# Patient Record
Sex: Female | Born: 1965 | Race: Black or African American | Hispanic: No | Marital: Single | Smoking: Current some day smoker
Health system: Southern US, Community
[De-identification: ages and names within clinical notes are randomized; demographics above are authoritative.]

## PROBLEM LIST (undated history)

## (undated) DIAGNOSIS — B20 Human immunodeficiency virus [HIV] disease: Secondary | ICD-10-CM

## (undated) DIAGNOSIS — B1920 Unspecified viral hepatitis C without hepatic coma: Secondary | ICD-10-CM

## (undated) DIAGNOSIS — J449 Chronic obstructive pulmonary disease, unspecified: Secondary | ICD-10-CM

## (undated) HISTORY — PX: ABDOMINAL HYSTERECTOMY: SHX81

## (undated) HISTORY — PX: ABDOMINAL SURGERY: SHX537

---

## 2017-02-23 ENCOUNTER — Encounter: Payer: Self-pay | Admitting: Emergency Medicine

## 2017-02-23 ENCOUNTER — Emergency Department
Admission: EM | Admit: 2017-02-23 | Discharge: 2017-02-23 | Disposition: A | Discharge status: Home / Self Care | Payer: Self-pay | Attending: Student in an Organized Health Care Education/Training Program | Admitting: Student in an Organized Health Care Education/Training Program

## 2017-02-23 ENCOUNTER — Emergency Department: Payer: Self-pay

## 2017-02-23 DIAGNOSIS — Z79899 Other long term (current) drug therapy: Secondary | ICD-10-CM | POA: Insufficient documentation

## 2017-02-23 DIAGNOSIS — F17200 Nicotine dependence, unspecified, uncomplicated: Secondary | ICD-10-CM | POA: Insufficient documentation

## 2017-02-23 DIAGNOSIS — J209 Acute bronchitis, unspecified: Secondary | ICD-10-CM | POA: Insufficient documentation

## 2017-02-23 DIAGNOSIS — R05 Cough: Secondary | ICD-10-CM | POA: Insufficient documentation

## 2017-02-23 DIAGNOSIS — B20 Human immunodeficiency virus [HIV] disease: Secondary | ICD-10-CM | POA: Insufficient documentation

## 2017-02-23 DIAGNOSIS — J449 Chronic obstructive pulmonary disease, unspecified: Secondary | ICD-10-CM | POA: Insufficient documentation

## 2017-02-23 DIAGNOSIS — R0602 Shortness of breath: Secondary | ICD-10-CM | POA: Insufficient documentation

## 2017-02-23 HISTORY — DX: Unspecified viral hepatitis C without hepatic coma: B19.20

## 2017-02-23 HISTORY — DX: Human immunodeficiency virus (HIV) disease: B20

## 2017-02-23 HISTORY — DX: Chronic obstructive pulmonary disease, unspecified: J44.9

## 2017-02-23 MED ORDER — AZITHROMYCIN 250 MG PO TABS: ORAL_TABLET | ORAL | 0 refills | Status: DC

## 2017-02-23 MED ORDER — IPRATROPIUM-ALBUTEROL 0.5-2.5 (3) MG/3ML IN SOLN
3.0000 mL | Freq: Once | RESPIRATORY_TRACT | Status: AC
  Administered 2017-02-23: 3 mL via RESPIRATORY_TRACT
  Filled 2017-02-23: qty 3

## 2017-02-23 MED ORDER — ALBUTEROL SULFATE HFA 108 (90 BASE) MCG/ACT IN AERS: 2.0000 | INHALATION_SPRAY | Freq: Four times a day (QID) | RESPIRATORY_TRACT | 2 refills | Status: AC | PRN

## 2017-02-23 MED ORDER — PREDNISONE 10 MG (21) PO TBPK: ORAL_TABLET | ORAL | 0 refills | Status: DC

## 2017-02-23 NOTE — ED Provider Notes (Signed)
[facility] Emergency Department Provider Note  ____________________________________________   First MD Initiated Contact with Patient 02/23/17 1518     (approximate)  I have reviewed the triage vital signs and the nursing notes.   HISTORY  Chief Complaint Nasal Congestion and Cough    HPI Kenney Housemananya Dobbins is a 52 y.o. female complains of cough and congestion, she states she has been a little short of breath today she is winded easily, states she has not noticed wheezing but she does feel like she cannot move air , she states she has had the symptoms for a week, she had a low-grade fever couple of days ago but none today, she does have nasal congestion and sinus pain, she denies any swelling in the feet or ankles  Past Medical History:  Diagnosis Date  . COPD (chronic obstructive pulmonary disease) (HCC)   . Hepatitis C   . HIV (human immunodeficiency virus infection) (HCC)     There are no active problems to display for this patient.   Past Surgical History:  Procedure Laterality Date  . ABDOMINAL SURGERY      Prior to Admission medications   Medication Sig Start Date End Date Taking? Authorizing Provider  albuterol (PROVENTIL HFA;VENTOLIN HFA) 108 (90 Base) MCG/ACT inhaler Inhale 2 puffs into the lungs every 6 (six) hours as needed for wheezing or shortness of breath. 02/23/17   Sherrie MustacheFisher, Roselyn BeringSusan W, PA-C  azithromycin (ZITHROMAX Z-PAK) 250 MG tablet 2 pills today then 1 pill a day for 4 days 02/23/17   Sherrie MustacheFisher, Roselyn BeringSusan W, PA-C  predniSONE (STERAPRED UNI-PAK 21 TAB) 10 MG (21) TBPK tablet Take 6 pills on day one then decrease by 1 pill each day 02/23/17   Faythe GheeFisher, Rocko Fesperman W, PA-C    Allergies Patient has no known allergies.  No family history on file.  Social History Social History   Tobacco Use  . Smoking status: Current Some Day Smoker  . Smokeless tobacco: Never Used  Substance Use Topics  . Alcohol use: Yes  . Drug use: No    Review of  Systems  Constitutional: No fever/chills Eyes: No visual changes. ENT: No sore throat.  Positive sinus pain and congestion Respiratory: Positive cough ABD: Denies vomiting or diarrhea Genitourinary: Negative for dysuria. Musculoskeletal: Negative for back pain. Skin: Negative for rash.    ____________________________________________   PHYSICAL EXAM:  VITAL SIGNS: ED Triage Vitals [02/23/17 1506]  Enc Vitals Group     BP (!) 137/92     Pulse Rate 89     Resp 20     Temp 98.3 F (36.8 C)     Temp Source Oral     SpO2 99 %     Weight 192 lb (87.1 kg)     Height 5\' 6"  (1.676 m)     Head Circumference      Peak Flow      Pain Score 9     Pain Loc      Pain Edu?      Excl. in GC?     Constitutional: Alert and oriented. Well appearing and in no acute distress. Eyes: Conjunctivae are normal.  Head: Atraumatic. Nose: Positive congestion/rhinnorhea. Mouth/Throat: Mucous membranes are moist.   Cardiovascular: Normal rate, regular rhythm.  Heart sounds are normal Respiratory: Normal respiratory effort.  No retractions, lungs with decreased air movement, patient become short of breath moving from the chair to the stretcher GU: deferred Musculoskeletal: FROM all extremities, warm and well perfused Neurologic:  Normal speech and language.  Skin:  Skin is warm, dry and intact. No rash noted. Psychiatric: Mood and affect are normal. Speech and behavior are normal.  ____________________________________________   LABS (all labs ordered are listed, but only abnormal results are displayed)  Labs Reviewed - No data to display ____________________________________________   ____________________________________________  RADIOLOGY   Chest x-ray is negative for pneumonia ____________________________________________   PROCEDURES  Procedure(s) performed: DuoNeb x2 Procedures    ____________________________________________   INITIAL IMPRESSION / ASSESSMENT AND PLAN /  ED COURSE  Pertinent labs & imaging results that were available during my care of the patient were reviewed by me and considered in my medical decision making (see chart for details).  Patient's 53 year old female presents to the emergency department complaining of cough and some shortness of breath, she states she has been sick for a week, she had a low-grade fever a few days ago but none today, she states she just feels like she is not moving air through her lungs  On physical exam the patient appears nontoxic, she is afebrile, lungs are clear but she has decreased air movement, she gets a little short of breath when she moves from a chair over to the stretcher, the remainder of the exam is benign    ----------------------------------------- 4:45 PM on 02/23/2017 -----------------------------------------  Chest x-ray is negative, patient is now wheezing after the DuoNeb, she has increased air movement, second DuoNeb was ordered  ----------------------------------------- 6:47 PM on 02/23/2017 -----------------------------------------  After the second DuoNeb, the lungs are clear to auscultation and the patient states she feels much better, diagnosis is bronchitis, she is discharged with a prescription for Z-Pak, steroid pack, and albuterol inhaler  As part of my medical decision making, I reviewed the following data within the electronic MEDICAL RECORD NUMBER Old chart reviewed, Radiograph reviewed cxr was normal, Notes from prior ED visits and [place] Controlled Substance Database  ____________________________________________   FINAL CLINICAL IMPRESSION(S) / ED DIAGNOSES  Final diagnoses:  Acute bronchitis, unspecified organism      NEW MEDICATIONS STARTED DURING THIS VISIT:  New Prescriptions   ALBUTEROL (PROVENTIL HFA;VENTOLIN HFA) 108 (90 BASE) MCG/ACT INHALER    Inhale 2 puffs into the lungs every 6 (six) hours as needed for wheezing or shortness of breath.   AZITHROMYCIN (ZITHROMAX  Z-PAK) 250 MG TABLET    2 pills today then 1 pill a day for 4 days   PREDNISONE (STERAPRED UNI-PAK 21 TAB) 10 MG (21) TBPK TABLET    Take 6 pills on day one then decrease by 1 pill each day     Note:  This document was prepared using Dragon voice recognition software and may include unintentional dictation errors.    Faythe Ghee, PA-C 02/23/17 Tito Dine, MD 02/23/17 1900

## 2017-02-23 NOTE — Discharge Instructions (Signed)
Follow-up with your regular doctor if you are not better in 3-5 days, use medication as prescribed, return to the emergency department if you are worsening

## 2017-02-23 NOTE — ED Notes (Signed)
Cough and nasal congestion X 1 week. Reports nonproductive cough. Pt alert and oriented X4, active, cooperative, pt in NAD. RR even and unlabored, color WNL.

## 2017-02-23 NOTE — ED Triage Notes (Signed)
Pt comes into the ED via [facility] c/o cough and congestion.  Denies any fevers at home and patient denies any chest pain or shortness of breath.  Patient ambulatory at this time with even and unlabored respirations.  Patient states he cough is dry with no production.

## 2017-12-05 ENCOUNTER — Ambulatory Visit: Payer: Self-pay

## 2018-01-30 ENCOUNTER — Ambulatory Visit: Payer: Self-pay | Attending: Oncology

## 2018-01-30 ENCOUNTER — Ambulatory Visit
Admission: RE | Admit: 2018-01-30 | Discharge: 2018-01-30 | Disposition: A | Discharge status: Home / Self Care | Payer: Self-pay | Source: Ambulatory Visit | Attending: Oncology | Admitting: Oncology

## 2018-01-30 ENCOUNTER — Encounter (INDEPENDENT_AMBULATORY_CARE_PROVIDER_SITE_OTHER): Payer: Self-pay

## 2018-01-30 VITALS — Ht 66.0 in | Wt 200.0 lb

## 2018-01-30 DIAGNOSIS — Z0000 Encounter for general adult medical examination without abnormal findings: Secondary | ICD-10-CM | POA: Insufficient documentation

## 2018-01-30 NOTE — Progress Notes (Signed)
  Subjective:     Patient ID: Emily Riggs, female   DOB: [birth year] Riggs, 52 y.o.   MRN: [id number]  HPI   Review of Systems     Objective:   Physical Exam Chest:     Breasts:        Right: No swelling, bleeding, inverted nipple, mass, nipple discharge, skin change or tenderness.        Left: No swelling, bleeding, inverted nipple, mass, nipple discharge, skin change (large pendulous breasts) or tenderness.     Comments: Large pendulous breasts       Assessment:     52 year old patient presents for [facility] clinic visit.  Patient screened, and meets [facility] eligibility.  Patient does not have insurance, Medicare or Medicaid.  Handout given on Affordable Care Act.  Instructed patient on breast self awareness using teach back method.  Clinical breast exam unremarkable.  No mass or lump palpated.  Patient reports [place] and [place] was [place].  Explained radiologist would probably wait for outside films to read mammogram.  Risk Assessment    Risk Scores      01/30/2018   Last edited by: Emily Riggs, Emily Gillingham F, RN   5-year risk: 1.3 %   Lifetime risk: 8.2 %             Plan:     Sent for bilateral screening mammogram.

## 2018-02-09 ENCOUNTER — Emergency Department: Payer: Self-pay

## 2018-02-09 ENCOUNTER — Emergency Department
Admission: EM | Admit: 2018-02-09 | Discharge: 2018-02-09 | Disposition: A | Discharge status: Home / Self Care | Payer: Self-pay | Attending: Student in an Organized Health Care Education/Training Program | Admitting: Student in an Organized Health Care Education/Training Program

## 2018-02-09 ENCOUNTER — Other Ambulatory Visit: Payer: Self-pay

## 2018-02-09 DIAGNOSIS — S42254A Nondisplaced fracture of greater tuberosity of right humerus, initial encounter for closed fracture: Secondary | ICD-10-CM | POA: Insufficient documentation

## 2018-02-09 DIAGNOSIS — Y929 Unspecified place or not applicable: Secondary | ICD-10-CM | POA: Insufficient documentation

## 2018-02-09 DIAGNOSIS — Y92009 Unspecified place in unspecified non-institutional (private) residence as the place of occurrence of the external cause: Secondary | ICD-10-CM

## 2018-02-09 DIAGNOSIS — Y939 Activity, unspecified: Secondary | ICD-10-CM | POA: Insufficient documentation

## 2018-02-09 DIAGNOSIS — F17200 Nicotine dependence, unspecified, uncomplicated: Secondary | ICD-10-CM | POA: Insufficient documentation

## 2018-02-09 DIAGNOSIS — W19XXXA Unspecified fall, initial encounter: Secondary | ICD-10-CM

## 2018-02-09 DIAGNOSIS — Y999 Unspecified external cause status: Secondary | ICD-10-CM | POA: Insufficient documentation

## 2018-02-09 DIAGNOSIS — W010XXA Fall on same level from slipping, tripping and stumbling without subsequent striking against object, initial encounter: Secondary | ICD-10-CM | POA: Insufficient documentation

## 2018-02-09 MED ORDER — NAPROXEN 500 MG PO TABS: 500.0000 mg | ORAL_TABLET | Freq: Two times a day (BID) | ORAL | 0 refills | Status: AC

## 2018-02-09 MED ORDER — HYDROCODONE-ACETAMINOPHEN 5-325 MG PO TABS: 1.0000 | ORAL_TABLET | Freq: Every evening | ORAL | 0 refills | Status: AC | PRN

## 2018-02-09 NOTE — ED Notes (Signed)
See triage note  Presents s/p fall   States she fell about 1 month ago  States she had her arm out in front of her  Has been having pain to right shoulder but states pain is getting worse and moving into neck

## 2018-02-09 NOTE — Discharge Instructions (Signed)
Follow-up with your primary care provider if any continued pain medication as needed.  Also Dr. Odis Luster is on call for orthopedics.  You may call make an appointment with him if any continued problems with your shoulder.  X-rays show that it is healing.  Begin taking naproxen 500 mg twice daily for inflammation and pain.  Norco 1 tablet at bedtime if needed for pain to allow you to sleep.

## 2018-02-09 NOTE — ED Provider Notes (Signed)
[facility] Emergency Department Provider Note   ____________________________________________   None    (approximate)  I have reviewed the triage vital signs and the nursing notes.   HISTORY  Chief Complaint Shoulder Pain   HPI Emily Riggs is a 53 y.o. female presents to the ED with complaint of right shoulder pain for 1 month.  Patient states that she slipped at home and hit her right shoulder.  Patient denies any head injury or loss of consciousness during this event.  Patient has been taking Tylenol for shoulder pain without any relief.  Patient states that pain was worse last evening while trying to sleep.  She rates her pain as 6 out of 10.   Past Medical History:  Diagnosis Date  . COPD (chronic obstructive pulmonary disease) (HCC)   . Hepatitis C   . HIV (human immunodeficiency virus infection) (HCC)     There are no active problems to display for this patient.   Past Surgical History:  Procedure Laterality Date  . ABDOMINAL HYSTERECTOMY    . ABDOMINAL SURGERY      Prior to Admission medications   Medication Sig Start Date End Date Taking? Authorizing Provider  albuterol (PROVENTIL HFA;VENTOLIN HFA) 108 (90 Base) MCG/ACT inhaler Inhale 2 puffs into the lungs every 6 (six) hours as needed for wheezing or shortness of breath. 02/23/17   Fisher, Roselyn BeringSusan W, PA-C  HYDROcodone-acetaminophen (NORCO/VICODIN) 5-325 MG tablet Take 1 tablet by mouth at bedtime as needed for moderate pain. 02/09/18   Tommi RumpsSummers, Raysha Tilmon L, PA-C  naproxen (NAPROSYN) 500 MG tablet Take 1 tablet (500 mg total) by mouth 2 (two) times daily with a meal. 02/09/18   Tommi RumpsSummers, Hersel Mcmeen L, PA-C    Allergies Patient has no known allergies.  No family history on file.  Social History Social History   Tobacco Use  . Smoking status: Current Some Day Smoker  . Smokeless tobacco: Never Used  Substance Use Topics  . Alcohol use: Yes  . Drug use: No    Review of  Systems Constitutional: No fever/chills Eyes: No visual changes. ENT: No trauma. Cardiovascular: Denies chest pain. Respiratory: Denies shortness of breath. Gastrointestinal: No abdominal pain.  No nausea, no vomiting. Musculoskeletal: Positive right shoulder pain. Skin: Negative for rash. Neurological: Negative for headaches, focal weakness or numbness. Allergic/Immunilogical: Positive for HIV.  Positive for hepatitis C.  ____________________________________________   PHYSICAL EXAM:  VITAL SIGNS: ED Triage Vitals  Enc Vitals Group     BP 02/09/18 0644 128/83     Pulse Rate 02/09/18 0644 78     Resp 02/09/18 0644 18     Temp 02/09/18 0644 98 F (36.7 C)     Temp Source 02/09/18 0644 Oral     SpO2 02/09/18 0644 98 %     Weight 02/09/18 0642 200 lb (90.7 kg)     Height 02/09/18 0642 5\' 6"  (1.676 m)     Head Circumference --      Peak Flow --      Pain Score 02/09/18 0642 6     Pain Loc --      Pain Edu? --      Excl. in GC? --    Constitutional: Alert and oriented. Well appearing and in no acute distress. Eyes: Conjunctivae are normal.  Head: Atraumatic. Nose: No congestion/rhinnorhea. Mouth/Throat: Mucous membranes are moist.  Oropharynx non-erythematous. Neck: No stridor.   Cardiovascular: Normal rate, regular rhythm. Grossly normal heart sounds.  Good peripheral circulation. Respiratory: Normal  respiratory effort.  No retractions. Lungs CTAB. Gastrointestinal: Soft and nontender. No distention.  Musculoskeletal: Examination of the right shoulder there is no gross deformity and no ecchymosis or abrasions were seen.  Range of motion is without crepitus but is limited in abduction secondary to discomfort.  Patient is able to abduct to shoulder level without difficulty but above the shoulder is difficult and painful.  Skin is intact.  Pulse is present.  Good muscle strength bilaterally. Neurologic:  Normal speech and language. No gross focal neurologic deficits are  appreciated. No gait instability. Skin:  Skin is warm, dry and intact.  No ecchymosis or abrasions seen. Psychiatric: Mood and affect are normal. Speech and behavior are normal.  ____________________________________________   LABS (all labs ordered are listed, but only abnormal results are displayed)  Labs Reviewed - No data to display   RADIOLOGY  ED MD interpretation:  Right shoulder x-ray shows nondisplaced fracture greater tuberosity of the humerus.  Official radiology report(s): Dg Shoulder Right  Result Date: 02/09/2018 CLINICAL DATA:  53 year old female with right-sided shoulder pain for the past month since slipping and falling while at home EXAM: RIGHT SHOULDER - 2+ VIEW COMPARISON:  None. FINDINGS: Irregularity in the region of the greater tuberosity. Suspect an underlying nondisplaced healing fracture. The humeral head remains located. Minimal degenerative change at the glenohumeral joint. The visualized thorax is within normal limits. No lytic or blastic osseous lesion. IMPRESSION: Suspect a healing, nondisplaced fracture of the greater tuberosity of the humerus. Electronically Signed   By: Malachy MoanHeath  McCullough M.D.   On: 02/09/2018 07:38   ____________________________________________   PROCEDURES  Procedure(s) performed: None  Procedures  Critical Care performed: No  ____________________________________________   INITIAL IMPRESSION / ASSESSMENT AND PLAN / ED COURSE  As part of my medical decision making, I reviewed the following data within the electronic MEDICAL RECORD NUMBER Notes from prior ED visits and [place] Controlled Substance Database  Patient presents to the ED with complaint of right shoulder pain.  Patient states that she fell 1 month ago at home while celebrating.  Patient denies any head injury or loss of consciousness during this event.  She has been taking over-the-counter Tylenol without relief.  X-ray is suggestive of a healing nondisplaced fracture of the  greater tuberosity right humerus.  Patient was made aware.  Patient was given a prescription for naproxen 500 mg twice daily for the next 7 days and Norco 1 at bedtime as needed for pain.  ____________________________________________   FINAL CLINICAL IMPRESSION(S) / ED DIAGNOSES  Final diagnoses:  Closed nondisplaced fracture of greater tuberosity of right humerus, initial encounter  Fall at home, initial encounter     ED Discharge Orders         Ordered    naproxen (NAPROSYN) 500 MG tablet  2 times daily with meals     02/09/18 0821    HYDROcodone-acetaminophen (NORCO/VICODIN) 5-325 MG tablet  At bedtime PRN     02/09/18 [id number]           Note:  This document was prepared using Dragon voice recognition software and may include unintentional dictation errors.    Tommi RumpsSummers, Slevin Gunby L, PA-C 02/09/18 1141    Willy Eddyobinson, Patrick, MD 02/09/18 1227

## 2018-02-09 NOTE — ED Triage Notes (Signed)
Pt arrives to ED via POV from home with c/o right-sided shoulder pain x1 month. Pt reports having a slip and fall at home, no head injury or LOC. Pt reports consistent right shoulder pain since the fall, has taken OTC medications without relief. Pt has strong and equal hand grips, CMS intact.

## 2018-02-13 NOTE — Progress Notes (Unsigned)
Letter mailed from [facility] to notify of normal mammogram results.  Patient to return in one year for annual screening.  Copy to [facility]. 

## 2019-01-21 IMAGING — MG DIGITAL SCREENING BILATERAL MAMMOGRAM WITH TOMO AND CAD
8 series · 8 of 24 positions shown · non-contrast
Comparison: Previous exam(s).

CLINICAL DATA: Screening.

EXAM:
DIGITAL SCREENING BILATERAL MAMMOGRAM WITH TOMO AND CAD

[R CC synth-2D]
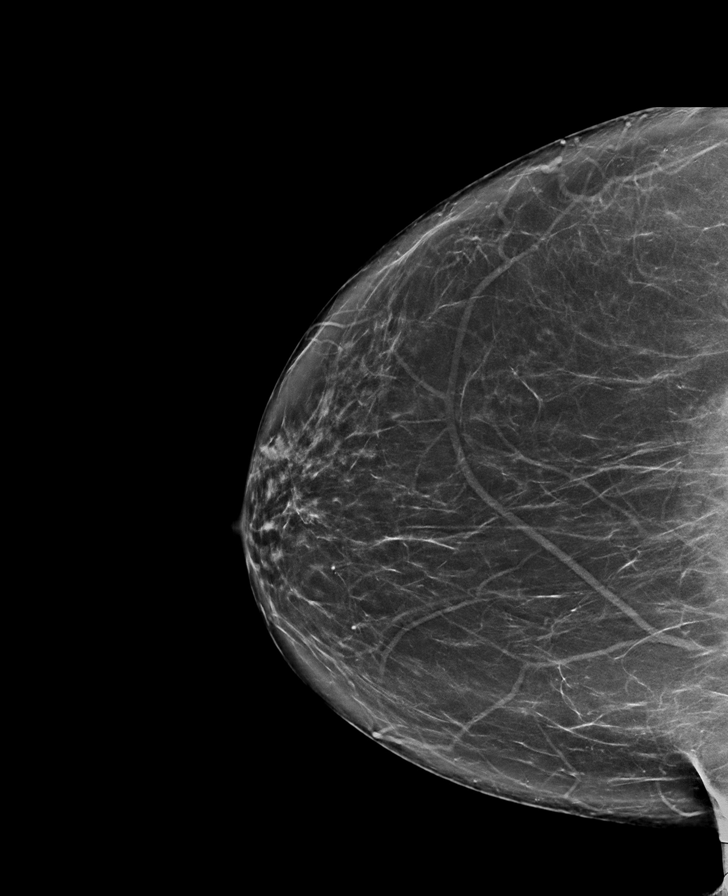

[L MLO synth-2D]
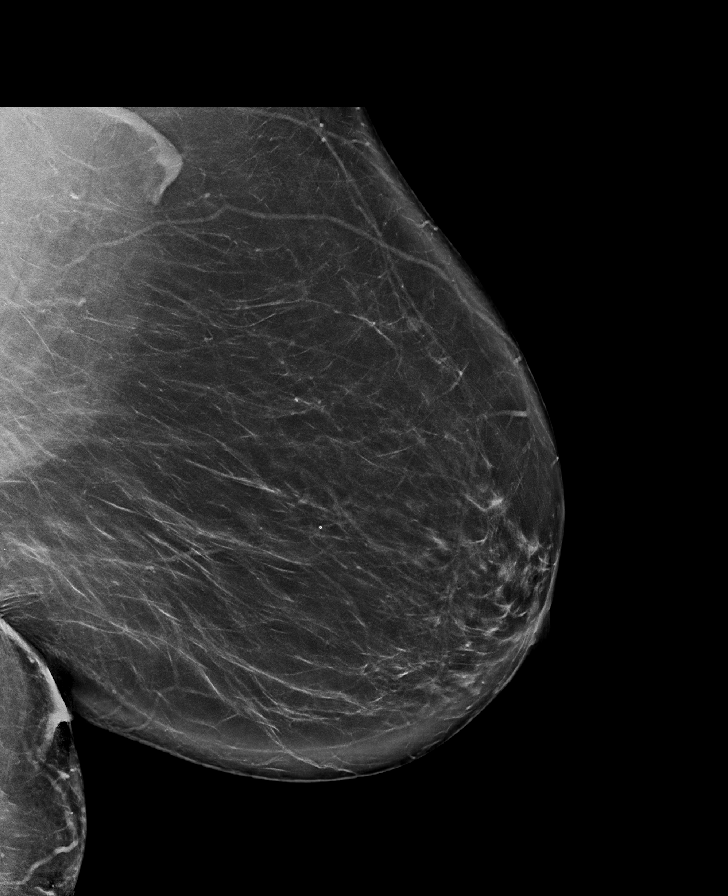

[L CC synth-2D]
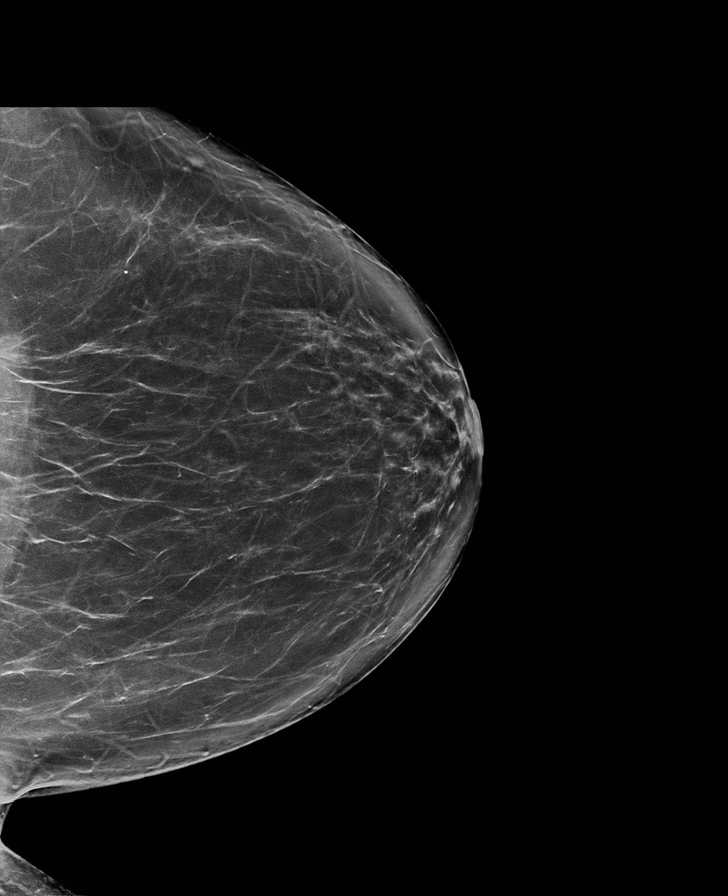

[R MLO synth-2D]
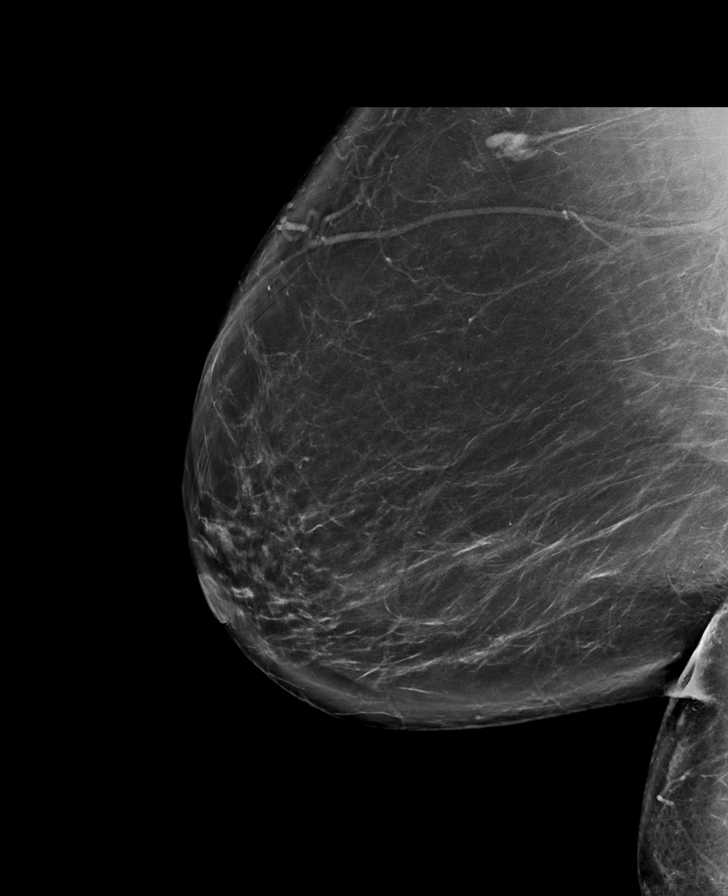

[R MLO tomo · tomo slice 49/97.0]
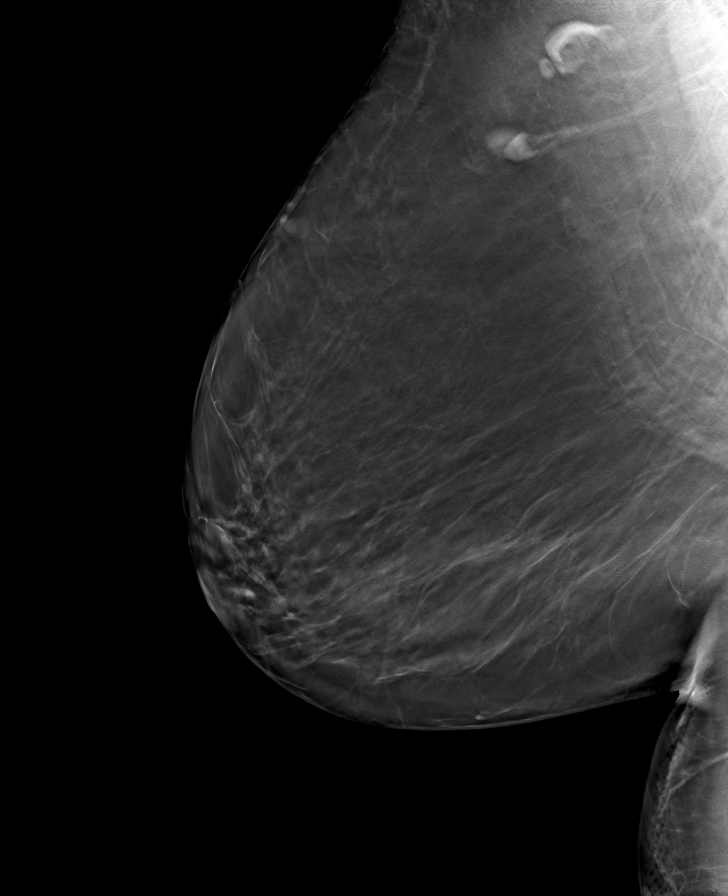

[L MLO tomo · tomo slice 47/94.0]
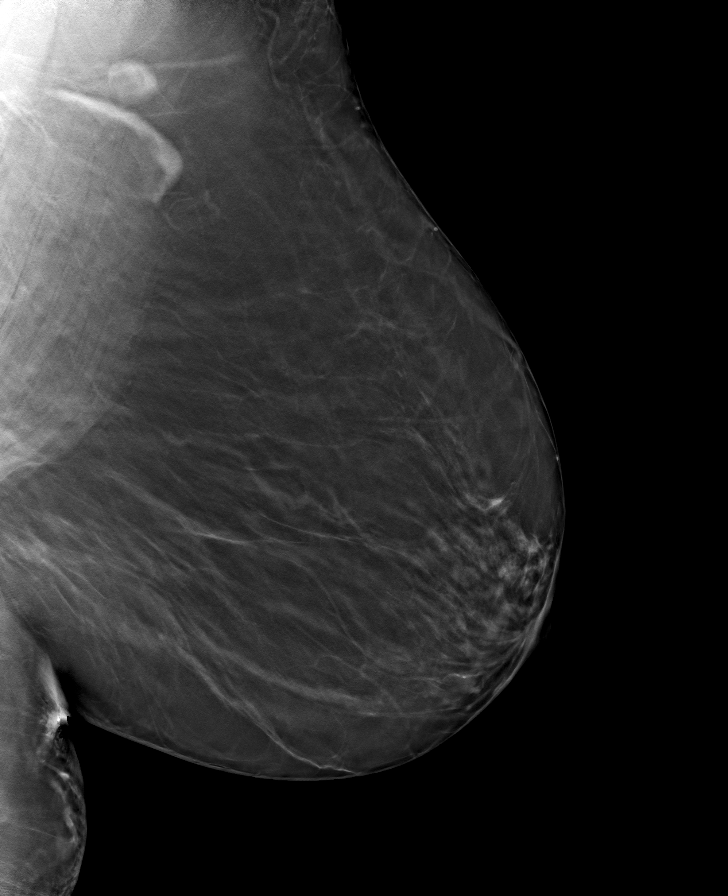

[R CC tomo · tomo slice 41/81.0]
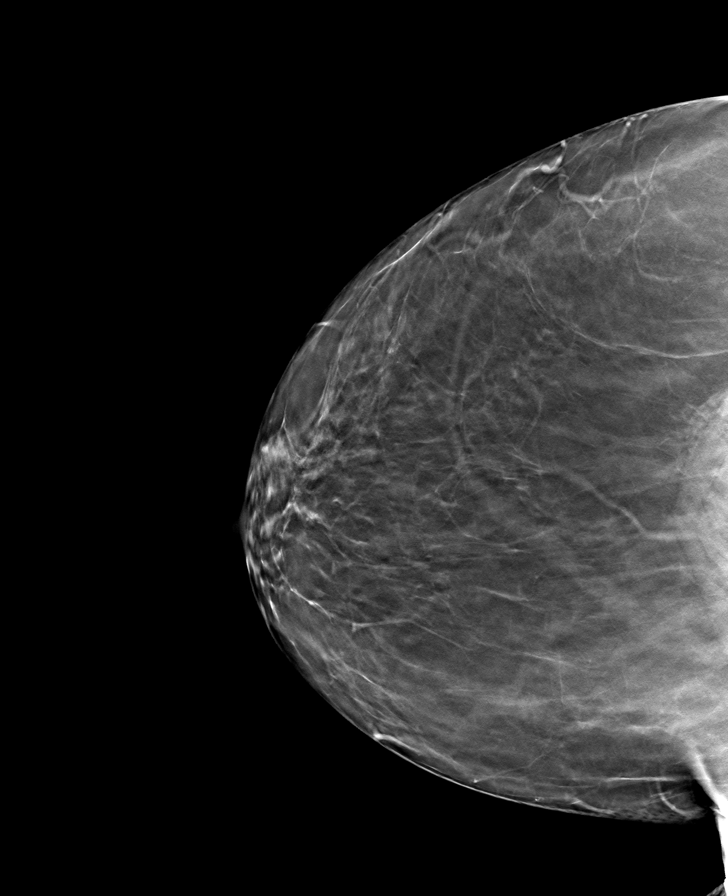

[L CC tomo · tomo slice 43/86.0]
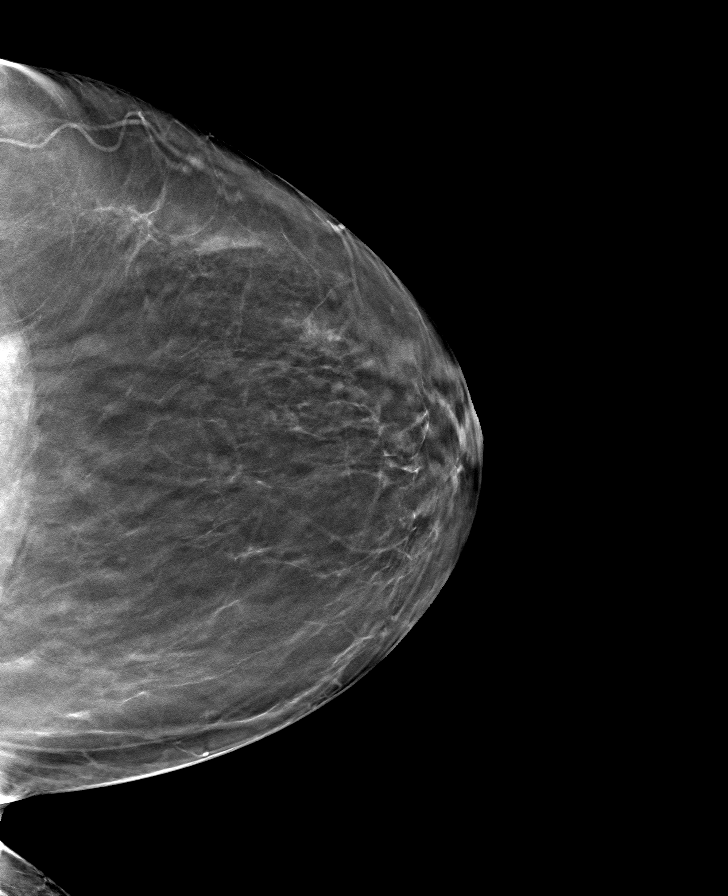

[8 of 24 positions shown; findings below may reference images not displayed]

ACR Breast Density Category b: There are scattered areas of
fibroglandular density.
FINDINGS: There are no findings suspicious for malignancy. Images were
processed with CAD.
IMPRESSION: No mammographic evidence of malignancy. A result letter of this
screening mammogram will be mailed directly to the patient.

RECOMMENDATION:
Screening mammogram in one year. (Code:[id number])

BI-RADS CATEGORY  1: Negative.

## 2021-03-07 ENCOUNTER — Emergency Department: Payer: Self-pay

## 2021-03-07 ENCOUNTER — Encounter: Payer: Self-pay | Admitting: Emergency Medicine

## 2021-03-07 ENCOUNTER — Other Ambulatory Visit: Payer: Self-pay

## 2021-03-07 ENCOUNTER — Emergency Department
Admission: EM | Admit: 2021-03-07 | Discharge: 2021-03-07 | Disposition: A | Discharge status: Home / Self Care | Payer: Self-pay | Attending: Emergency Medicine | Admitting: Emergency Medicine

## 2021-03-07 DIAGNOSIS — J449 Chronic obstructive pulmonary disease, unspecified: Secondary | ICD-10-CM | POA: Insufficient documentation

## 2021-03-07 DIAGNOSIS — Z20822 Contact with and (suspected) exposure to covid-19: Secondary | ICD-10-CM | POA: Insufficient documentation

## 2021-03-07 DIAGNOSIS — Z21 Asymptomatic human immunodeficiency virus [HIV] infection status: Secondary | ICD-10-CM | POA: Insufficient documentation

## 2021-03-07 DIAGNOSIS — J189 Pneumonia, unspecified organism: Secondary | ICD-10-CM | POA: Insufficient documentation

## 2021-03-07 LAB — COMPREHENSIVE METABOLIC PANEL
ALT: 10 U/L (ref 0–44)
AST: 16 U/L (ref 15–41)
Albumin: 3.1 g/dL — ABNORMAL LOW (ref 3.5–5.0)
Alkaline Phosphatase: 59 U/L (ref 38–126)
Anion gap: 8 (ref 5–15)
BUN: 9 mg/dL (ref 6–20)
CO2: 24 mmol/L (ref 22–32)
Calcium: 8.7 mg/dL — ABNORMAL LOW (ref 8.9–10.3)
Chloride: 104 mmol/L (ref 98–111)
Creatinine, Ser: 0.67 mg/dL (ref 0.44–1.00)
GFR, Estimated: >60 mL/min (ref >60)
Glucose, Bld: 133 mg/dL — ABNORMAL HIGH (ref 70–99)
Potassium: 3.1 mmol/L — ABNORMAL LOW (ref 3.5–5.1)
Sodium: 136 mmol/L (ref 135–145)
Total Bilirubin: 0.3 mg/dL (ref 0.3–1.2)
Total Protein: 7.3 g/dL (ref 6.5–8.1)

## 2021-03-07 LAB — CBC WITH DIFFERENTIAL/PLATELET
Abs Immature Granulocytes: 0.11 10*3/uL — ABNORMAL HIGH (ref 0.00–0.07)
Basophils Absolute: 0 10*3/uL (ref 0.0–0.1)
Basophils Relative: 0 %
Eosinophils Absolute: 0.1 10*3/uL (ref 0.0–0.5)
Eosinophils Relative: 1 %
HCT: 34.6 % — ABNORMAL LOW (ref 36.0–46.0)
Hemoglobin: 11.3 g/dL — ABNORMAL LOW (ref 12.0–15.0)
Immature Granulocytes: 1 %
Lymphocytes Relative: 14 %
Lymphs Abs: 1.4 10*3/uL (ref 0.7–4.0)
MCH: 30.2 pg (ref 26.0–34.0)
MCHC: 32.7 g/dL (ref 30.0–36.0)
MCV: 92.5 fL (ref 80.0–100.0)
Monocytes Absolute: 1.3 10*3/uL — ABNORMAL HIGH (ref 0.1–1.0)
Monocytes Relative: 13 %
Neutro Abs: 7.3 10*3/uL (ref 1.7–7.7)
Neutrophils Relative %: 71 %
Platelets: 296 10*3/uL (ref 150–400)
RBC: 3.74 MIL/uL — ABNORMAL LOW (ref 3.87–5.11)
RDW: 14.7 % (ref 11.5–15.5)
WBC: 10.2 10*3/uL (ref 4.0–10.5)
nRBC: 0 % (ref 0.0–0.2)

## 2021-03-07 LAB — TROPONIN I (HIGH SENSITIVITY): Troponin I (High Sensitivity): <2 ng/L (ref <18)

## 2021-03-07 LAB — RESP PANEL BY RT-PCR (FLU A&B, COVID) ARPGX2
Influenza A by PCR: NEGATIVE
Influenza B by PCR: NEGATIVE
SARS Coronavirus 2 by RT PCR: NEGATIVE

## 2021-03-07 MED ORDER — GUAIFENESIN-CODEINE 100-10 MG/5ML PO SOLN: 5.0000 mL | Freq: Four times a day (QID) | ORAL | 0 refills | Status: AC | PRN

## 2021-03-07 MED ORDER — IPRATROPIUM-ALBUTEROL 0.5-2.5 (3) MG/3ML IN SOLN: 3.0000 mL | Freq: Four times a day (QID) | RESPIRATORY_TRACT | 0 refills | Status: AC | PRN

## 2021-03-07 MED ORDER — CEPHALEXIN 500 MG PO CAPS: 500.0000 mg | ORAL_CAPSULE | Freq: Three times a day (TID) | ORAL | 0 refills | Status: AC

## 2021-03-07 MED ORDER — AZITHROMYCIN 500 MG PO TABS
500.0000 mg | ORAL_TABLET | Freq: Once | ORAL | Status: AC
  Administered 2021-03-07: 500 mg via ORAL
  Filled 2021-03-07: qty 1

## 2021-03-07 MED ORDER — CEPHALEXIN 500 MG PO CAPS
500.0000 mg | ORAL_CAPSULE | Freq: Once | ORAL | Status: AC
  Administered 2021-03-07: 500 mg via ORAL
  Filled 2021-03-07: qty 1

## 2021-03-07 MED ORDER — AZITHROMYCIN 250 MG PO TABS: ORAL_TABLET | ORAL | 0 refills | Status: AC

## 2021-03-07 NOTE — ED Provider Triage Note (Signed)
°  Emergency Medicine Provider Triage Evaluation Note  Emily Riggs , a 56 y.o.female,  was evaluated in triage.  Pt complains of chest pain and shortness of breath.  Patient states that this been going on for the past few weeks.  Endorses cough, congestion, left-sided chest pain, shortness of breath.  Denies fever/chills, abdominal pain, back pain, or urinary symptoms.   Review of Systems  Positive: Chest pain, shortness of breath Negative: Denies fever, abdominal pain, vomiting  Physical Exam  There were no vitals filed for this visit. Gen:   Awake, no distress   Resp:  Normal effort  MSK:   Moves extremities without difficulty  Other:    Medical Decision Making  Given the patient's initial medical screening exam, the following diagnostic evaluation has been ordered. The patient will be placed in the appropriate treatment space, once one is available, to complete the evaluation and treatment. I have discussed the plan of care with the patient and I have advised the patient that an ED physician or mid-level practitioner will reevaluate their condition after the test results have been received, as the results may give them additional insight into the type of treatment they may need.    Diagnostics: Labs, EKG, CXR, respiratory panel.  Treatments: none immediately   Varney Daily, [place] 03/07/21 1230

## 2021-03-07 NOTE — Discharge Instructions (Signed)
Please take your antibiotics as prescribed for their entire course.  Please take your cough medication as needed, as we discussed do not drink alcohol or drive while taking this medication.  Please use your nebulizer treatment every 6 hours or so as needed for shortness of breath or wheezing.  Please follow-up with your doctor for recheck/reevaluation, as we discussed we do recommend obtaining a repeat chest x-ray in 4 to 6 weeks, to ensure resolution of pneumonia.  Return to the emergency department for any difficulty breathing or any other symptom personally concerning to yourself.

## 2021-03-07 NOTE — ED Provider Notes (Signed)
[facility] Provider Note    Event Date/Time   First MD Initiated Contact with Patient 03/07/21 1326     (approximate)  History   Chief Complaint: Chest Pain and Shortness of Breath  HPI  Emily Riggs is a 56 y.o. female with a past medical history of COPD, HIV, presents to the emergency department for cough and shortness of breath.  According to the patient for the past week or so she has been coughing with occasional sputum production.  States she has woken up with chills at night but denies any measured temperature.  Patient states she has had pneumonia previously and was concerned that she could have pneumonia again.  States she has had some discomfort in the right chest as well.  Physical Exam   Triage Vital Signs: ED Triage Vitals [03/07/21 1231]  Enc Vitals Group     BP (!) 151/83     Pulse Rate 81     Resp 20     Temp 98.5 F (36.9 C)     Temp Source Oral     SpO2 97 %     Weight 179 lb (81.2 kg)     Height 5\' 6"  (1.676 m)     Head Circumference      Peak Flow      Pain Score 7     Pain Loc      Pain Edu?      Excl. in GC?     Most recent vital signs: Vitals:   03/07/21 1231  BP: (!) 151/83  Pulse: 81  Resp: 20  Temp: 98.5 F (36.9 C)  SpO2: 97%    General: Awake, no distress.  CV:  Good peripheral perfusion.  Regular rate and rhythm  Resp:  Normal effort.  Equal breath sounds bilaterally.  Very slight expiratory wheeze. Abd:  No distention.  Soft, nontender.  No rebound or guarding.    ED Results / Procedures / Treatments   EKG  EKG viewed and interpreted by myself shows a normal sinus rhythm at 75 bpm with a narrow QRS, normal axis, normal intervals, no concerning ST changes.  RADIOLOGY  I have personally reviewed the x-ray images patient does appear to have some haziness in the right upper lobe. Radiology is read likely right upper pneumonia   MEDICATIONS ORDERED IN ED: Medications  azithromycin (ZITHROMAX)  tablet 500 mg (has no administration in time range)  cephALEXin (KEFLEX) capsule 500 mg (has no administration in time range)     IMPRESSION / MDM / ASSESSMENT AND PLAN / ED COURSE  I reviewed the triage vital signs and the nursing notes.  Patient presents to the emergency department for 1 week of cough and some mild chest discomfort.  Reassuringly patient's lab work is normal including a normal troponin, reassuring chemistry and normal white blood cell count.  Patient's chest x-ray however appears to show a right upper lobe pneumonia, radiology confirms right upper lobe pneumonia.  Overall the patient appears well, no distress.  No trouble breathing.  Does have a slight expiratory wheeze, states she has access to a nebulizer at home we will prescribe duo nebs for the patient to be used.  Given the x-ray findings we will start the patient on antibiotics.  Given the patient's history I did consider admission however given her reassuring lab work reassuring physical exam vitals I believe the patient would be safe for discharge home with PCP follow-up for recheck/reevaluation.  Patient is agreeable to this  plan of care.  FINAL CLINICAL IMPRESSION(S) / ED DIAGNOSES   Right upper lobe pneumonia  Rx / DC Orders   Codeine/guaifenesin cough medication Keflex/Zithromax DuoNebs  Note:  This document was prepared using Dragon voice recognition software and may include unintentional dictation errors.   Minna Antis, MD 03/07/21 1349

## 2021-03-07 NOTE — ED Triage Notes (Signed)
Pt via POV from home. Pt c/o R sided CP and SOB for the past week. Pain radiates down her R arm. States that she has a hx of bronchitis. Also has a productive cough. Denies fevers. Denies NVD. Denies cardiac hx. Pt is A&OX4 and NAD

## 2023-03-24 ENCOUNTER — Other Ambulatory Visit: Payer: Self-pay | Admitting: Nurse Practitioner

## 2023-03-24 DIAGNOSIS — Z1231 Encounter for screening mammogram for malignant neoplasm of breast: Secondary | ICD-10-CM

## 2023-03-25 ENCOUNTER — Encounter: Payer: Self-pay | Admitting: Emergency Medicine
